# Patient Record
Sex: Female | Born: 1985 | Race: White | Hispanic: No | Marital: Single | Smoking: Current every day smoker
Health system: Southern US, Community
[De-identification: ages and names within clinical notes are randomized; demographics above are authoritative.]

## PROBLEM LIST (undated history)

## (undated) DIAGNOSIS — F419 Anxiety disorder, unspecified: Secondary | ICD-10-CM

## (undated) DIAGNOSIS — J45909 Unspecified asthma, uncomplicated: Secondary | ICD-10-CM

## (undated) DIAGNOSIS — F32A Depression, unspecified: Secondary | ICD-10-CM

## (undated) DIAGNOSIS — K219 Gastro-esophageal reflux disease without esophagitis: Secondary | ICD-10-CM

## (undated) DIAGNOSIS — R000 Tachycardia, unspecified: Secondary | ICD-10-CM

## (undated) DIAGNOSIS — R51 Headache: Secondary | ICD-10-CM

## (undated) DIAGNOSIS — R42 Dizziness and giddiness: Secondary | ICD-10-CM

## (undated) DIAGNOSIS — T7840XA Allergy, unspecified, initial encounter: Secondary | ICD-10-CM

## (undated) DIAGNOSIS — R519 Headache, unspecified: Secondary | ICD-10-CM

## (undated) DIAGNOSIS — E559 Vitamin D deficiency, unspecified: Secondary | ICD-10-CM

## (undated) HISTORY — DX: Tachycardia, unspecified: R00.0

## (undated) HISTORY — DX: Allergy, unspecified, initial encounter: T78.40XA

## (undated) HISTORY — DX: Headache, unspecified: R51.9

## (undated) HISTORY — DX: Anxiety disorder, unspecified: F41.9

## (undated) HISTORY — DX: Headache: R51

## (undated) HISTORY — PX: WISDOM TOOTH EXTRACTION: SHX21

## (undated) HISTORY — DX: Gastro-esophageal reflux disease without esophagitis: K21.9

## (undated) HISTORY — DX: Vitamin D deficiency, unspecified: E55.9

## (undated) HISTORY — DX: Depression, unspecified: F32.A

## (undated) HISTORY — DX: Dizziness and giddiness: R42

---

## 2003-04-03 ENCOUNTER — Encounter: Payer: Self-pay | Admitting: Emergency Medicine

## 2003-04-03 ENCOUNTER — Emergency Department (HOSPITAL_COMMUNITY): Admission: AD | Admit: 2003-04-03 | Discharge: 2003-04-03 | Payer: Self-pay | Admitting: Emergency Medicine

## 2004-05-31 ENCOUNTER — Emergency Department (HOSPITAL_COMMUNITY): Admission: EM | Admit: 2004-05-31 | Discharge: 2004-05-31 | Payer: Self-pay | Admitting: *Deleted

## 2004-06-19 ENCOUNTER — Emergency Department: Payer: Self-pay | Admitting: Emergency Medicine

## 2004-06-19 ENCOUNTER — Other Ambulatory Visit: Payer: Self-pay

## 2004-08-04 ENCOUNTER — Ambulatory Visit (HOSPITAL_COMMUNITY): Admission: RE | Admit: 2004-08-04 | Discharge: 2004-08-04 | Payer: Self-pay | Admitting: Family Medicine

## 2005-08-10 ENCOUNTER — Emergency Department (HOSPITAL_COMMUNITY): Admission: EM | Admit: 2005-08-10 | Discharge: 2005-08-10 | Payer: Self-pay | Admitting: Emergency Medicine

## 2006-03-04 ENCOUNTER — Emergency Department (HOSPITAL_COMMUNITY): Admission: EM | Admit: 2006-03-04 | Discharge: 2006-03-04 | Payer: Self-pay | Admitting: Emergency Medicine

## 2006-09-01 ENCOUNTER — Emergency Department: Payer: Self-pay | Admitting: Emergency Medicine

## 2006-09-03 ENCOUNTER — Emergency Department: Payer: Self-pay | Admitting: Internal Medicine

## 2006-10-10 ENCOUNTER — Emergency Department: Payer: Self-pay | Admitting: Emergency Medicine

## 2007-11-30 ENCOUNTER — Emergency Department (HOSPITAL_COMMUNITY): Admission: EM | Admit: 2007-11-30 | Discharge: 2007-11-30 | Payer: Self-pay | Admitting: Emergency Medicine

## 2008-12-27 ENCOUNTER — Emergency Department (HOSPITAL_COMMUNITY): Admission: EM | Admit: 2008-12-27 | Discharge: 2008-12-28 | Payer: Self-pay | Admitting: Emergency Medicine

## 2014-12-03 ENCOUNTER — Encounter (HOSPITAL_COMMUNITY): Payer: Self-pay

## 2014-12-03 ENCOUNTER — Emergency Department (HOSPITAL_COMMUNITY): Payer: Managed Care, Other (non HMO)

## 2014-12-03 ENCOUNTER — Emergency Department (HOSPITAL_COMMUNITY)
Admission: EM | Admit: 2014-12-03 | Discharge: 2014-12-03 | Disposition: A | Discharge status: Home / Self Care | Payer: Managed Care, Other (non HMO) | Attending: Emergency Medicine | Admitting: Emergency Medicine

## 2014-12-03 DIAGNOSIS — F419 Anxiety disorder, unspecified: Secondary | ICD-10-CM | POA: Diagnosis not present

## 2014-12-03 DIAGNOSIS — R064 Hyperventilation: Secondary | ICD-10-CM | POA: Diagnosis not present

## 2014-12-03 DIAGNOSIS — R079 Chest pain, unspecified: Secondary | ICD-10-CM

## 2014-12-03 DIAGNOSIS — J45901 Unspecified asthma with (acute) exacerbation: Secondary | ICD-10-CM | POA: Diagnosis not present

## 2014-12-03 DIAGNOSIS — R0602 Shortness of breath: Secondary | ICD-10-CM | POA: Diagnosis present

## 2014-12-03 HISTORY — DX: Unspecified asthma, uncomplicated: J45.909

## 2014-12-03 LAB — I-STAT CHEM 8, ED
BUN: <3 mg/dL — ABNORMAL LOW (ref 6–20)
CALCIUM ION: 1.19 mmol/L (ref 1.12–1.23)
Chloride: 104 mmol/L (ref 101–111)
Creatinine, Ser: 0.8 mg/dL (ref 0.44–1.00)
GLUCOSE: 113 mg/dL — AB (ref 70–99)
HEMATOCRIT: 42 % (ref 36.0–46.0)
HEMOGLOBIN: 14.3 g/dL (ref 12.0–15.0)
POTASSIUM: 3.4 mmol/L — AB (ref 3.5–5.1)
Sodium: 141 mmol/L (ref 135–145)
TCO2: 20 mmol/L (ref 0–100)

## 2014-12-03 MED ORDER — IPRATROPIUM-ALBUTEROL 0.5-2.5 (3) MG/3ML IN SOLN
3.0000 mL | Freq: Once | RESPIRATORY_TRACT | Status: AC
  Administered 2014-12-03: 3 mL via RESPIRATORY_TRACT

## 2014-12-03 MED ORDER — KETOROLAC TROMETHAMINE 30 MG/ML IJ SOLN
30.0000 mg | Freq: Once | INTRAMUSCULAR | Status: AC
  Administered 2014-12-03: 30 mg via INTRAVENOUS
  Filled 2014-12-03: qty 1

## 2014-12-03 MED ORDER — METHYLPREDNISOLONE SODIUM SUCC 125 MG IJ SOLR
125.0000 mg | Freq: Once | INTRAMUSCULAR | Status: AC
  Administered 2014-12-03: 125 mg via INTRAVENOUS
  Filled 2014-12-03: qty 2

## 2014-12-03 MED ORDER — ALBUTEROL SULFATE (2.5 MG/3ML) 0.083% IN NEBU
2.5000 mg | INHALATION_SOLUTION | RESPIRATORY_TRACT | Status: DC | PRN
  Administered 2014-12-03: 2.5 mg via RESPIRATORY_TRACT
  Filled 2014-12-03: qty 3

## 2014-12-03 MED ORDER — LORAZEPAM 2 MG/ML IJ SOLN
2.0000 mg | Freq: Once | INTRAMUSCULAR | Status: AC
  Administered 2014-12-03: 2 mg via INTRAVENOUS
  Filled 2014-12-03: qty 1

## 2014-12-03 NOTE — ED Notes (Signed)
Pt appears anxious and is sitting on the side of the bed with her head faced downward. MD Fayrene FearingJames at bedside to assess patient. Aware she refused any anti-anxiety medications.

## 2014-12-03 NOTE — ED Notes (Addendum)
Pt presents with c/o wheezing, hx of asthma. Pt reports she was in [employer] and sprayed bug spray.  Pt is also hyperventilating, and is able to speak in complete and full sentences. 100% on RA.

## 2014-12-03 NOTE — ED Provider Notes (Addendum)
CSN: [id number]     Arrival date & time 12/03/14  2118 History   First MD Initiated Contact with Patient 12/03/14 2124     Chief Complaint  Patient presents with  . Asthma  . Anxiety      HPI  Presents for evaluation hyperventilating. States she has a history of asthma. She went to [employer] and apparently sprayed bug spray in the sent caused her to panic and breathing fast. She thought she was having anxiety attacks. She presents here rather dramatically hyperventilating. Did not have her inhaler with her. States she took a Xanax on the way and" is not helping"  Past Medical History  Diagnosis Date  . Asthma    History reviewed. No pertinent past surgical history. No family history on file. History  Substance Use Topics  . Smoking status: Never Smoker   . Smokeless tobacco: Not on file  . Alcohol Use: No   OB History    No data available     Review of Systems  Constitutional: Negative for fever, chills, diaphoresis, appetite change and fatigue.  HENT: Negative for mouth sores, sore throat and trouble swallowing.   Eyes: Negative for visual disturbance.  Respiratory: Positive for shortness of breath. Negative for cough, chest tightness and wheezing.   Cardiovascular: Negative for chest pain.  Gastrointestinal: Negative for nausea, vomiting, abdominal pain, diarrhea and abdominal distention.  Endocrine: Negative for polydipsia, polyphagia and polyuria.  Genitourinary: Negative for dysuria, frequency and hematuria.  Musculoskeletal: Negative for gait problem.  Skin: Negative for color change, pallor and rash.  Neurological: Negative for dizziness, syncope, light-headedness and headaches.  Hematological: Does not bruise/bleed easily.  Psychiatric/Behavioral: Negative for behavioral problems and confusion. The patient is nervous/anxious.       Allergies  Benadryl and Levaquin  Home Medications   Prior to Admission medications   Medication Sig Start Date End Date  Taking? Authorizing Provider  ALPRAZolam Prudy Feeler(XANAX) 1 MG tablet Take 1 tablet by mouth 2 (two) times daily as needed for anxiety.  12/03/14  Yes Historical Provider, MD  cyclobenzaprine (FLEXERIL) 10 MG tablet Take 1 tablet by mouth 3 (three) times daily as needed for muscle spasms.  12/03/14  Yes Historical Provider, MD  Ibuprofen (MIDOL PO) Take 2 tablets by mouth every 6 (six) hours as needed (cramps).   Yes Historical Provider, MD  oxyCODONE-acetaminophen (PERCOCET) 10-325 MG per tablet Take 1 tablet by mouth every 6 (six) hours as needed for pain.  11/07/14  Yes Historical Provider, MD  temazepam (RESTORIL) 15 MG capsule Take 1 capsule by mouth at bedtime. 10/08/14  Yes Historical Provider, MD   BP 126/80 mmHg  Pulse 121  Temp(Src) 98.4 F (36.9 C) (Oral)  Resp 12  SpO2 97%  LMP 12/01/2014 Physical Exam  Constitutional: She is oriented to person, place, and time. She appears well-developed and well-nourished. No distress.  HENT:  Head: Normocephalic.  Eyes: Conjunctivae are normal. Pupils are equal, round, and reactive to light. No scleral icterus.  Neck: Normal range of motion. Neck supple. No thyromegaly present.  Cardiovascular: Normal rate and regular rhythm.  Exam reveals no gallop and no friction rub.   No murmur heard. Pulmonary/Chest: Effort normal and breath sounds normal. Tachypnea noted. No respiratory distress. She has no wheezes. She has no rales.  Hyperventilating. Wheezing only with forced exhalation.  Abdominal: Soft. Bowel sounds are normal. She exhibits no distension. There is no tenderness. There is no rebound.  Musculoskeletal: Normal range of motion.  Neurological: She  is alert and oriented to person, place, and time.  Skin: Skin is warm and dry. No rash noted.  Psychiatric: Her mood appears anxious. Her affect is labile. Her speech is rapid and/or pressured.    ED Course  Procedures (including critical care time) Labs Review Labs Reviewed  I-STAT CHEM 8, ED -  Abnormal; Notable for the following:    Potassium 3.4 (*)    BUN <3 (*)    Glucose, Bld 113 (*)    All other components within normal limits    Imaging Review No results found.   EKG Interpretation None      MDM   Final diagnoses:  Chest pain  Hyperventilation    Patient given IV Ativan. Was given 1 albuterol neb. Normal chest x-ray other than some hyperventilation. Potassium 3.4. Normal EKG other than tachycardia. Well oxygenated 100%. Think she is appropriate for discharge home.    Rolland PorterMark Roxanne Orner, MD 12/03/14 [id number]  Rolland PorterMark Unita Detamore, MD 12/03/14 [phone]

## 2014-12-03 NOTE — Discharge Instructions (Signed)
Hyperventilation °Hyperventilation is breathing that is deeper and more rapid than normal. It is usually associated with panic and anxiety. Hyperventilation can make you feel breathless. It is sometimes called overbreathing. Breathing out too much causes a decrease in the amount of carbon dioxide gas in the blood. This leads to tingling and numbness in the hands, feet, and around the mouth. If this continues, your fingers, hands, and toes may begin to spasm. Hyperventilation usually lasts 20-30 minutes and can be associated with other symptoms of panic and anxiety, including:  °· Chest pains or tightness. °· A pounding or irregular, racing heartbeat (palpitations). °· Dizziness. °· Lightheadedness. °· Dry mouth. °· Weakness. °· Confusion. °· Sleep disturbance. °CAUSES  °Sudden onset (acute) hyperventilation is usually triggered by acute stress, anxiety, or emotional upset. Long-term (chronic) and recurring hyperventilation can occur with chronic lung problems, such emphysema or asthma. Other causes include:  °· Nervousness. °· Stress. °· Stimulant, drug, or alcohol use. °· Lung disease. °· Infections, such as pneumonia. °· Heart problems. °· Severe pain. °· Waking from a bad dream. °· Pregnancy. °· Bleeding. °HOME CARE INSTRUCTIONS °· Learn and use breathing exercises that help you breathe from your diaphragm and abdomen. °· Practice relaxation techniques to reduce stress, such as visualization, meditation, and muscle release. °· During an attack, try breathing into a paper bag. This changes the carbon dioxide level and slows down breathing. °SEEK IMMEDIATE MEDICAL CARE IF: °· Your hyperventilation continues or gets worse. °MAKE SURE YOU: °· Understand these instructions. °· Will watch your condition. °· Will get help right away if you are not doing well or get worse. °Document Released: 07/14/2000 Document Revised: 01/16/2012 Document Reviewed: 10/26/2011 °ExitCare® Patient Information ©2015 [employer]. This  information is not intended to replace advice given to you by your health care provider. Make sure you discuss any questions you have with your health care provider. ° °

## 2014-12-03 NOTE — ED Notes (Signed)
Pt audibly wheezing but is able to speak without labored breathing or wheezing intermittently. Able to carry on full conversation without pause before medications administered. Patient appears increasingly calm and relaxed after medication. Will re-evaluate promptly.

## 2016-09-19 ENCOUNTER — Other Ambulatory Visit (HOSPITAL_COMMUNITY): Payer: Self-pay | Admitting: Internal Medicine

## 2016-09-19 DIAGNOSIS — R51 Headache: Principal | ICD-10-CM

## 2016-09-19 DIAGNOSIS — R519 Headache, unspecified: Secondary | ICD-10-CM

## 2016-09-20 ENCOUNTER — Ambulatory Visit (HOSPITAL_COMMUNITY)
Admission: RE | Admit: 2016-09-20 | Discharge: 2016-09-20 | Disposition: A | Discharge status: Home / Self Care | Payer: Managed Care, Other (non HMO) | Source: Ambulatory Visit | Attending: Internal Medicine | Admitting: Internal Medicine

## 2016-09-20 DIAGNOSIS — R51 Headache: Secondary | ICD-10-CM | POA: Diagnosis not present

## 2016-09-20 DIAGNOSIS — R200 Anesthesia of skin: Secondary | ICD-10-CM | POA: Insufficient documentation

## 2016-09-20 DIAGNOSIS — R519 Headache, unspecified: Secondary | ICD-10-CM

## 2016-09-20 DIAGNOSIS — Z9689 Presence of other specified functional implants: Secondary | ICD-10-CM | POA: Insufficient documentation

## 2016-10-09 ENCOUNTER — Telehealth: Payer: Self-pay | Admitting: *Deleted

## 2016-10-09 NOTE — Telephone Encounter (Signed)
Left message notifying her that our office will be on a delayed opening on 10/09/16 and we need to reschedule her new patient appt.  Provided our number to call back.

## 2016-10-10 ENCOUNTER — Ambulatory Visit: Payer: Managed Care, Other (non HMO) | Admitting: Neurology

## 2016-10-24 ENCOUNTER — Ambulatory Visit (INDEPENDENT_AMBULATORY_CARE_PROVIDER_SITE_OTHER): Payer: Managed Care, Other (non HMO) | Admitting: Neurology

## 2016-10-24 ENCOUNTER — Encounter: Payer: Self-pay | Admitting: Neurology

## 2016-10-24 DIAGNOSIS — R200 Anesthesia of skin: Secondary | ICD-10-CM | POA: Diagnosis not present

## 2016-10-24 DIAGNOSIS — R42 Dizziness and giddiness: Secondary | ICD-10-CM | POA: Insufficient documentation

## 2016-10-24 MED ORDER — CYCLOBENZAPRINE HCL 10 MG PO TABS: 10.0000 mg | ORAL_TABLET | Freq: Three times a day (TID) | ORAL | 1 refills | Status: DC | PRN

## 2016-10-24 MED ORDER — DIAZEPAM 5 MG PO TABS: 5.0000 mg | ORAL_TABLET | Freq: Four times a day (QID) | ORAL | 0 refills | Status: DC | PRN
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
## 2016-11-16 ENCOUNTER — Ambulatory Visit
Admission: RE | Admit: 2016-11-16 | Discharge: 2016-11-16 | Disposition: A | Discharge status: Home / Self Care | Payer: Managed Care, Other (non HMO) | Source: Ambulatory Visit | Attending: Neurology | Admitting: Neurology

## 2016-11-16 DIAGNOSIS — R42 Dizziness and giddiness: Secondary | ICD-10-CM | POA: Diagnosis not present

## 2016-11-16 DIAGNOSIS — R200 Anesthesia of skin: Secondary | ICD-10-CM | POA: Diagnosis not present

## 2016-11-22 ENCOUNTER — Telehealth: Payer: Self-pay | Admitting: Neurology

## 2016-11-22 NOTE — Telephone Encounter (Addendum)
Please call patient MRI of brain showed evidence of active left maxillary sinus infection,evidence of fluid level in the left maxillary sinus  Brain parenchyma appears normal   asked her to see if she has any signs of infection she may contact her primary care for treatment IMPRESSION:  This MRI of the brain without contrast shows the following: 1.    The brain parenchyma appears normal. 2.    Acute left maxillary sinusitis.

## 2016-11-22 NOTE — Telephone Encounter (Deleted)
Please call patient MRI of brain showed evidence of active left maxillary sinus infection,evidence of fluid level in the left maxillary sinus   asked her to see if she has any signs of infection she may contact her primary care for treatment

## 2016-11-23 NOTE — Telephone Encounter (Signed)
error 

## 2016-11-23 NOTE — Telephone Encounter (Addendum)
Left patient detailed message with results and Dr. Zannie Cove recommendations (ok per [facility]).  Provided our number to call back with any questions.

## 2016-11-28 ENCOUNTER — Encounter: Payer: Self-pay | Admitting: Neurology

## 2016-11-28 ENCOUNTER — Ambulatory Visit (INDEPENDENT_AMBULATORY_CARE_PROVIDER_SITE_OTHER): Payer: Managed Care, Other (non HMO) | Admitting: Neurology

## 2016-11-28 VITALS — BP 140/86 | HR 119 | Ht 65.0 in | Wt 188.5 lb

## 2016-11-28 DIAGNOSIS — R42 Dizziness and giddiness: Secondary | ICD-10-CM | POA: Diagnosis not present

## 2016-11-28 DIAGNOSIS — R200 Anesthesia of skin: Secondary | ICD-10-CM

## 2016-11-28 MED ORDER — AMITRIPTYLINE HCL 25 MG PO TABS: 50.0000 mg | ORAL_TABLET | Freq: Every day | ORAL | 11 refills | Status: DC

## 2016-11-28 MED ORDER — ONDANSETRON 4 MG PO TBDP: 4.0000 mg | ORAL_TABLET | Freq: Three times a day (TID) | ORAL | 6 refills | Status: DC | PRN

## 2016-11-28 MED ORDER — ELETRIPTAN HYDROBROMIDE 40 MG PO TABS: 40.0000 mg | ORAL_TABLET | ORAL | 6 refills | Status: DC | PRN
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
## 2017-01-30 ENCOUNTER — Ambulatory Visit: Payer: Managed Care, Other (non HMO) | Admitting: Nurse Practitioner

## 2017-02-07 ENCOUNTER — Ambulatory Visit (INDEPENDENT_AMBULATORY_CARE_PROVIDER_SITE_OTHER): Payer: Self-pay | Admitting: Nurse Practitioner

## 2017-02-07 ENCOUNTER — Encounter: Payer: Self-pay | Admitting: Nurse Practitioner

## 2017-02-07 VITALS — BP 130/83 | HR 114 | Wt 186.4 lb

## 2017-02-07 DIAGNOSIS — R42 Dizziness and giddiness: Secondary | ICD-10-CM

## 2017-02-07 DIAGNOSIS — R519 Headache, unspecified: Secondary | ICD-10-CM

## 2017-02-07 DIAGNOSIS — R200 Anesthesia of skin: Secondary | ICD-10-CM

## 2017-02-07 DIAGNOSIS — R51 Headache: Secondary | ICD-10-CM

## 2017-02-07 DIAGNOSIS — G478 Other sleep disorders: Secondary | ICD-10-CM
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
## 2017-02-07 NOTE — Patient Instructions (Signed)
Will obtain MRI of the neck Continue curent meds  F/U with Dr. Terrace ArabiaYan in 2 months

## 2017-02-08 NOTE — Progress Notes (Signed)
I agree with the assessment and plan as directed by NP .WID   Ossie Beltran, MD  

## 2017-03-24 ENCOUNTER — Other Ambulatory Visit: Payer: Self-pay | Admitting: Neurology

## 2017-04-19 ENCOUNTER — Ambulatory Visit: Payer: Managed Care, Other (non HMO) | Admitting: Nurse Practitioner

## 2017-10-31 IMAGING — CT CT HEAD W/O CM
4 series · 17 of 47 positions shown, 19 images · non-contrast
Comparison: None.

CLINICAL DATA: Fell several days ago with small laceration of the
forehead, vomited, no loss of consciousness, continued headache

EXAM:
CT HEAD WITHOUT CONTRAST
TECHNIQUE: Contiguous axial images were obtained from the base of the skull
through the vertex without intravenous contrast.

[Series 2: head trauma wo · axial · 0.42mm/px · z∈[+14,+124]mm · 7 of 30 slices shown, 9 images]
[im 4/30  brain]
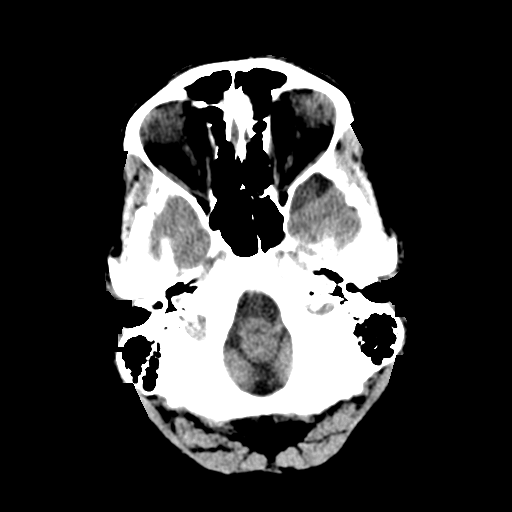
[im 4/30  bone]
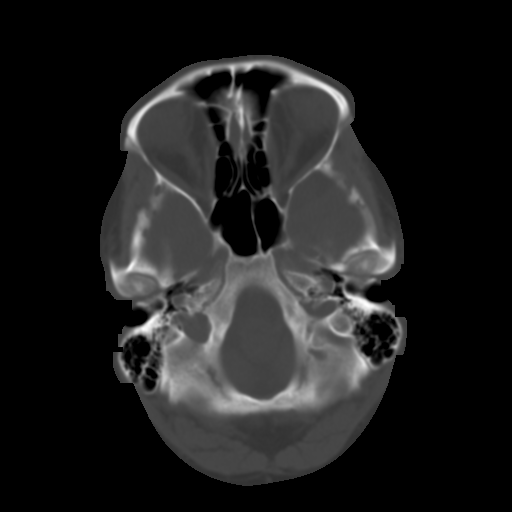
[im 8/30  brain]
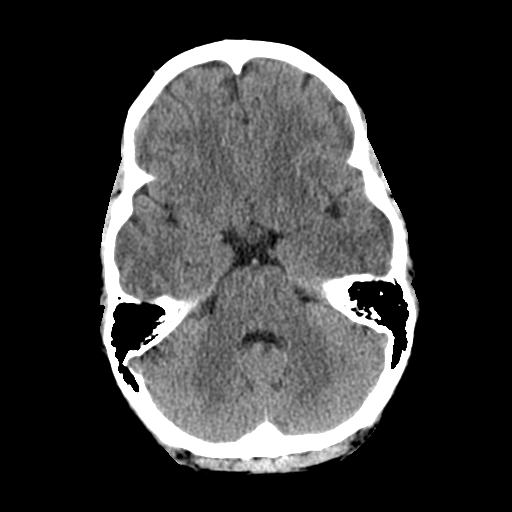
[im 11/30  brain]
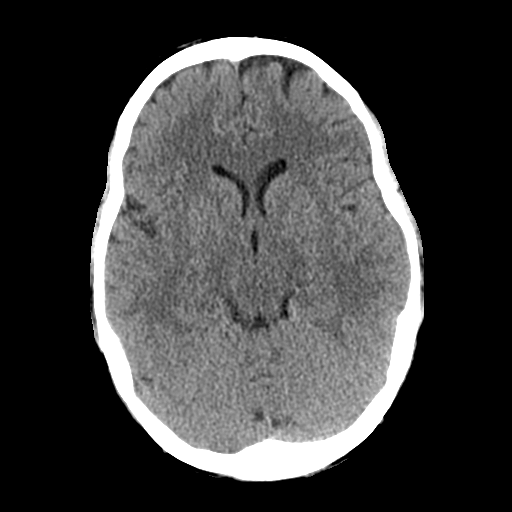
[im 15/30  brain]
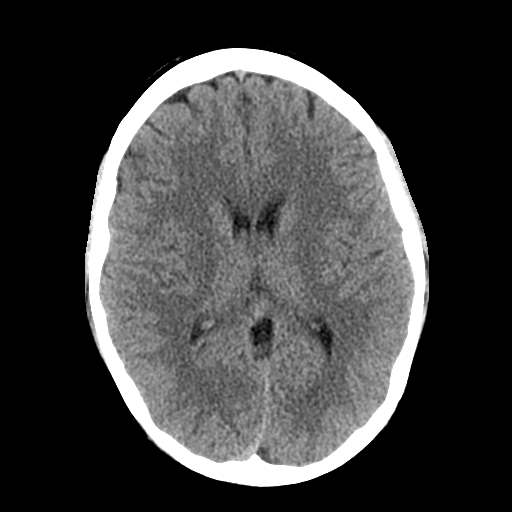
[im 19/30  brain]
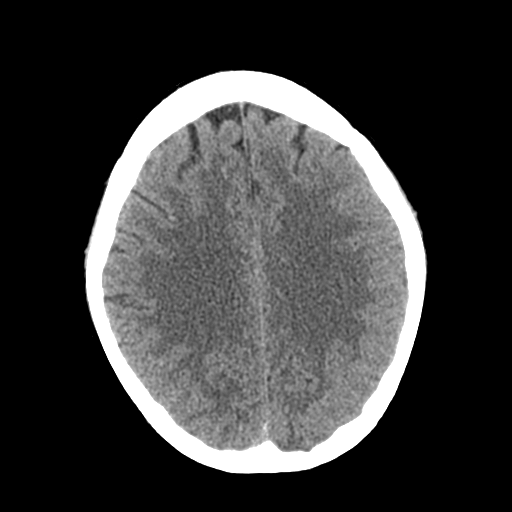
[im 19/30  bone]
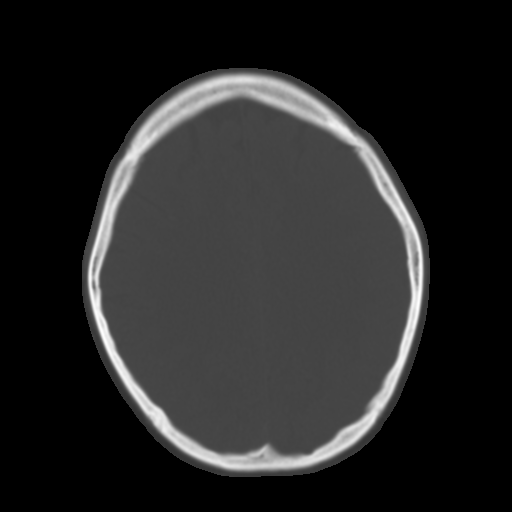
[im 22/30  brain]
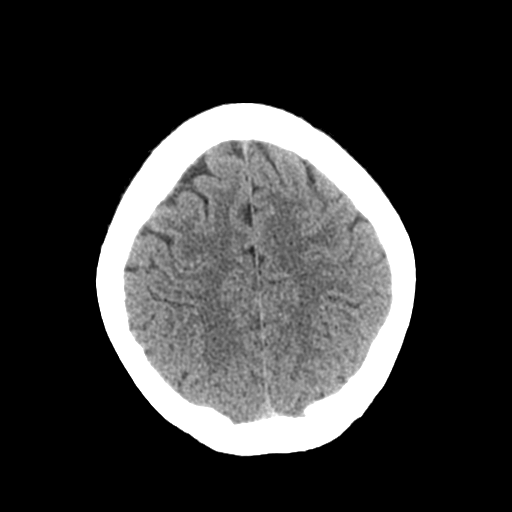
[im 26/30  brain]
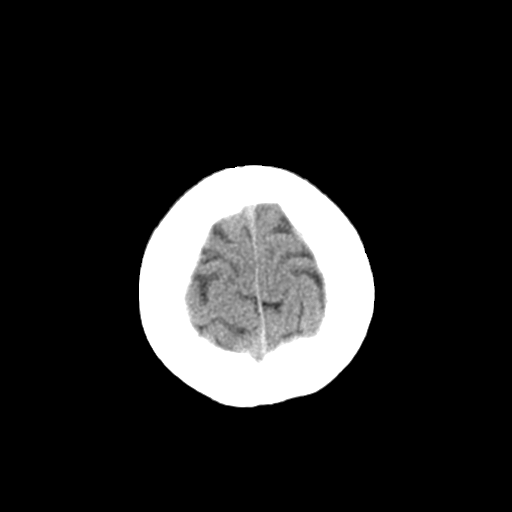

[Series 3: head bone · axial · 0.42mm/px · z∈[+13,+65]mm · 4 of 75 slices shown]
[im 8/75  bone]
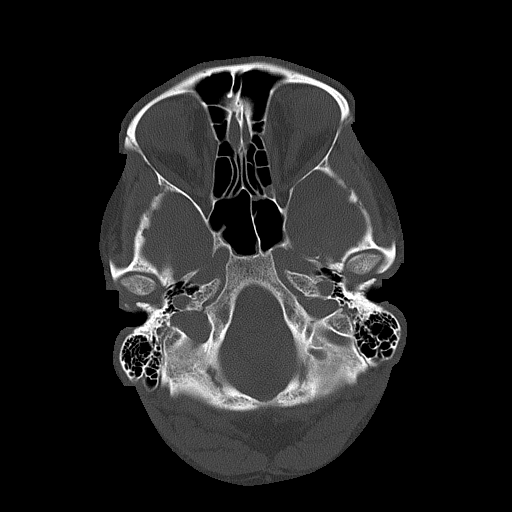
[im 15/75  bone]
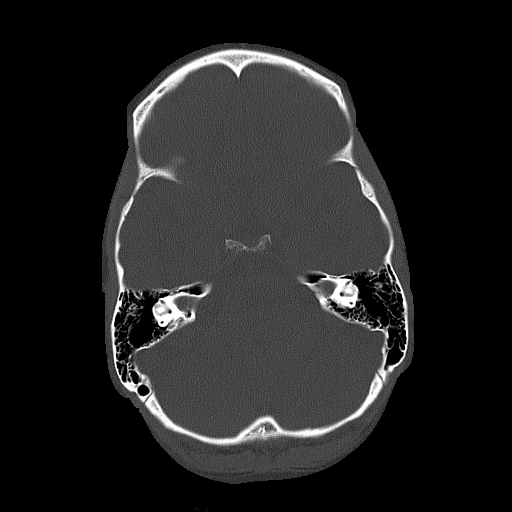
[im 23/75  bone]
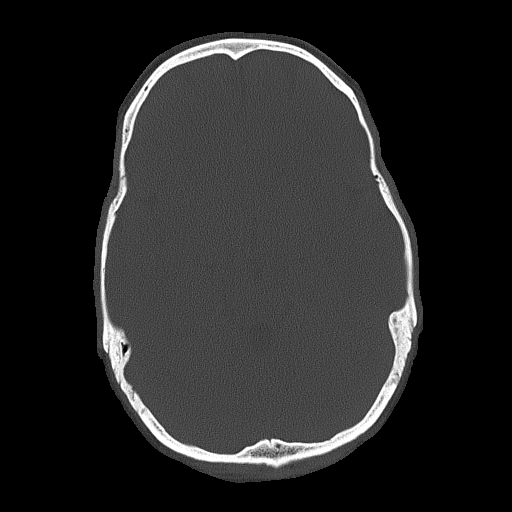
[im 34/75  bone]
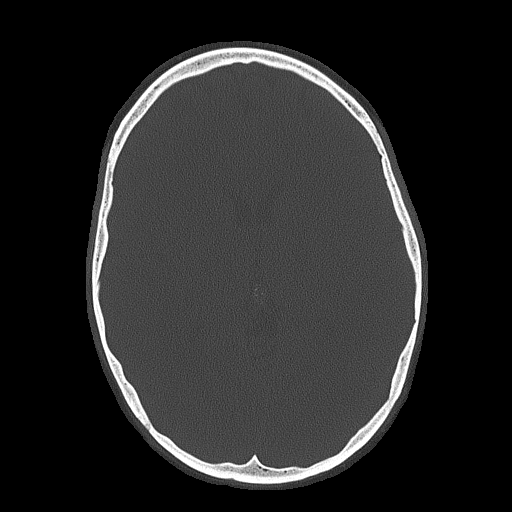

[Series 4: coronal soft tissue · coronal · 0.31mm/px · 3 of 67 slices shown]
[im 23/67  brain]
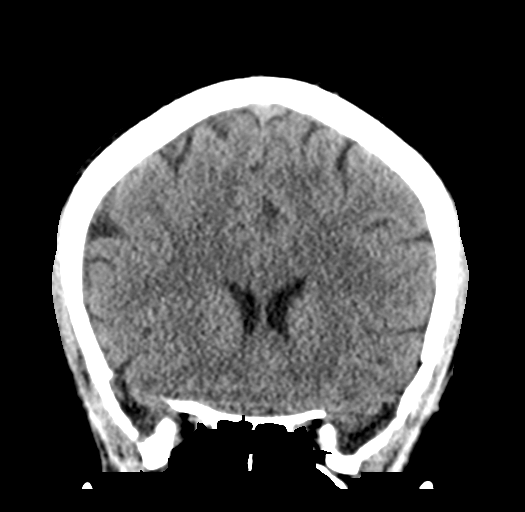
[im 30/67  brain]
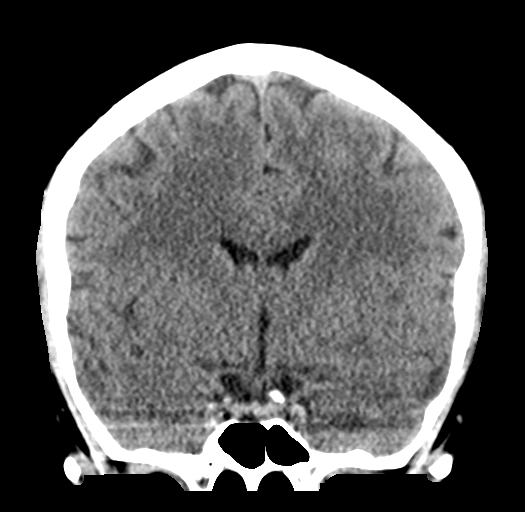
[im 37/67  brain]
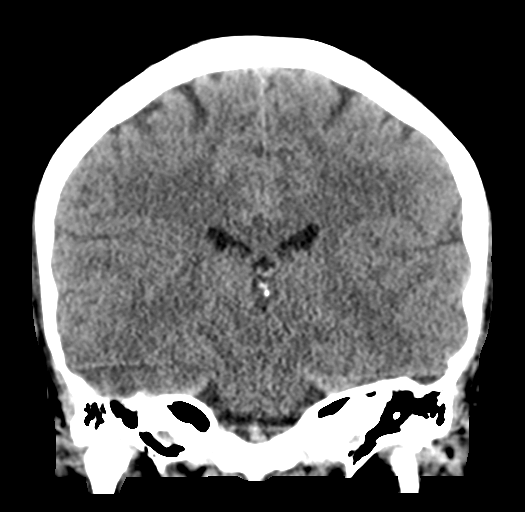

[Series 5: sagittal soft tissue · sagittal · 0.32mm/px · 3 of 55 slices shown]
[im 19/55  brain]
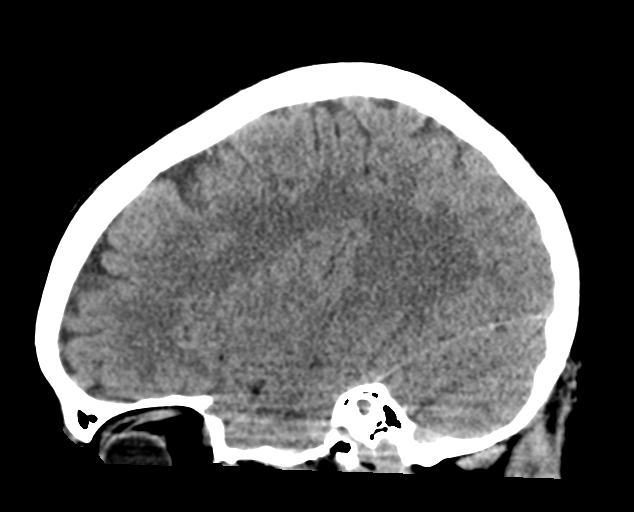
[im 28/55  brain]
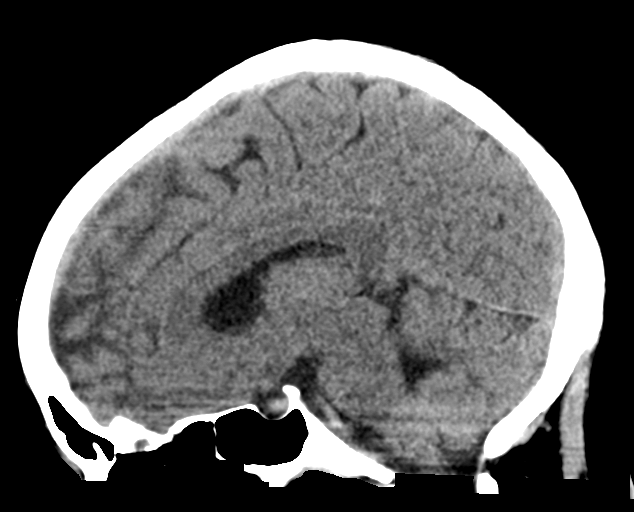
[im 37/55  brain]
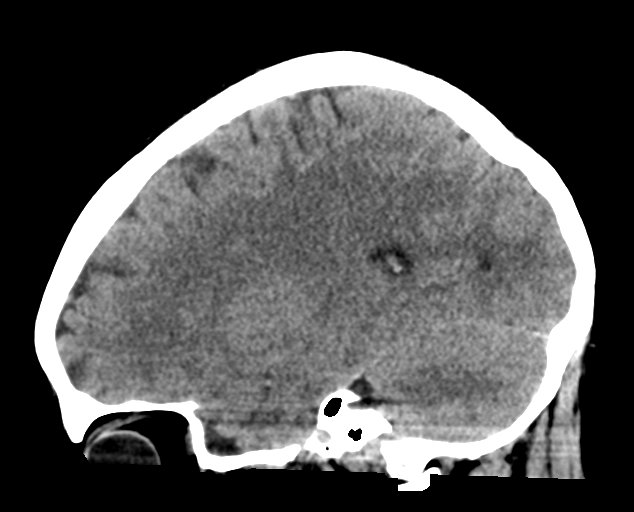

[17 of 47 positions shown; findings below may reference images not displayed]

FINDINGS: Brain: The ventricular system is normal in size and configuration
and the septum is midline in position. The fourth ventricle and
basilar cisterns are unremarkable. No hemorrhage, mass lesion, or
acute infarction is seen.

Vascular: No vascular abnormality is seen on this unenhanced study.

Skull: On bone window images, no calvarial abnormality is seen.

Sinuses/Orbits: The paranasal sinuses appear well pneumatized.

Other: Surgical staples are noted in the soft tissues of the right
frontal calvarium.
IMPRESSION: 1. Negative unenhanced CT of the brain.
2. Staples are noted within the soft tissues of the right frontal
scalp.

## 2020-02-27 ENCOUNTER — Encounter: Payer: Managed Care, Other (non HMO) | Admitting: Adult Health

## 2020-03-31 ENCOUNTER — Encounter: Payer: Self-pay | Admitting: Adult Health

## 2020-05-04 ENCOUNTER — Telehealth (INDEPENDENT_AMBULATORY_CARE_PROVIDER_SITE_OTHER): Payer: Self-pay

## 2020-05-17 NOTE — Telephone Encounter (Signed)
Not a patient; she was calling for father after she gave me all her own information. Error opening.

## 2022-04-29 ENCOUNTER — Ambulatory Visit
Admission: EM | Admit: 2022-04-29 | Discharge: 2022-04-29 | Disposition: A | Discharge status: Home / Self Care | Payer: Self-pay

## 2022-04-29 ENCOUNTER — Emergency Department (HOSPITAL_COMMUNITY)
Admission: EM | Admit: 2022-04-29 | Discharge: 2022-04-29 | Disposition: A | Discharge status: Home / Self Care | Payer: Self-pay | Attending: Emergency Medicine | Admitting: Emergency Medicine

## 2022-04-29 ENCOUNTER — Encounter (HOSPITAL_COMMUNITY): Payer: Self-pay | Admitting: *Deleted

## 2022-04-29 ENCOUNTER — Emergency Department (HOSPITAL_COMMUNITY): Payer: Self-pay

## 2022-04-29 ENCOUNTER — Other Ambulatory Visit: Payer: Self-pay

## 2022-04-29 DIAGNOSIS — J45901 Unspecified asthma with (acute) exacerbation: Secondary | ICD-10-CM | POA: Insufficient documentation

## 2022-04-29 DIAGNOSIS — Z7951 Long term (current) use of inhaled steroids: Secondary | ICD-10-CM | POA: Insufficient documentation

## 2022-04-29 DIAGNOSIS — F411 Generalized anxiety disorder: Secondary | ICD-10-CM

## 2022-04-29 DIAGNOSIS — R062 Wheezing: Secondary | ICD-10-CM

## 2022-04-29 DIAGNOSIS — F419 Anxiety disorder, unspecified: Secondary | ICD-10-CM | POA: Insufficient documentation

## 2022-04-29 LAB — CBC WITH DIFFERENTIAL/PLATELET
Abs Immature Granulocytes: 0.54 10*3/uL — ABNORMAL HIGH (ref 0.00–0.07)
Basophils Absolute: 0.1 10*3/uL (ref 0.0–0.1)
Basophils Relative: 0 %
Eosinophils Absolute: 0 10*3/uL (ref 0.0–0.5)
Eosinophils Relative: 0 %
HCT: 39.9 % (ref 36.0–46.0)
Hemoglobin: 13.5 g/dL (ref 12.0–15.0)
Immature Granulocytes: 2 %
Lymphocytes Relative: 12 %
Lymphs Abs: 3.1 10*3/uL (ref 0.7–4.0)
MCH: 30.5 pg (ref 26.0–34.0)
MCHC: 33.8 g/dL (ref 30.0–36.0)
MCV: 90.3 fL (ref 80.0–100.0)
Monocytes Absolute: 0.9 10*3/uL (ref 0.1–1.0)
Monocytes Relative: 4 %
Neutro Abs: 20.9 10*3/uL — ABNORMAL HIGH (ref 1.7–7.7)
Neutrophils Relative %: 82 %
Platelets: 470 10*3/uL — ABNORMAL HIGH (ref 150–400)
RBC: 4.42 MIL/uL (ref 3.87–5.11)
RDW: 12.9 % (ref 11.5–15.5)
WBC: 25.5 10*3/uL — ABNORMAL HIGH (ref 4.0–10.5)
nRBC: 0 % (ref 0.0–0.2)

## 2022-04-29 LAB — BASIC METABOLIC PANEL
Anion gap: 7 (ref 5–15)
BUN: 14 mg/dL (ref 6–20)
CO2: 26 mmol/L (ref 22–32)
Calcium: 9.1 mg/dL (ref 8.9–10.3)
Chloride: 102 mmol/L (ref 98–111)
Creatinine, Ser: 0.68 mg/dL (ref 0.44–1.00)
GFR, Estimated: >60 mL/min (ref >60)
Glucose, Bld: 130 mg/dL — ABNORMAL HIGH (ref 70–99)
Potassium: 3.6 mmol/L (ref 3.5–5.1)
Sodium: 135 mmol/L (ref 135–145)

## 2022-04-29 LAB — BRAIN NATRIURETIC PEPTIDE: B Natriuretic Peptide: 57 pg/mL (ref 0.0–100.0)

## 2022-04-29 MED ORDER — IPRATROPIUM-ALBUTEROL 0.5-2.5 (3) MG/3ML IN SOLN
RESPIRATORY_TRACT | Status: AC
  Administered 2022-04-29: 3 mL via RESPIRATORY_TRACT
  Filled 2022-04-29: qty 3

## 2022-04-29 MED ORDER — MAGNESIUM SULFATE 2 GM/50ML IV SOLN: 2.0000 g | Freq: Once | INTRAVENOUS | Status: DC

## 2022-04-29 MED ORDER — PREDNISONE 20 MG PO TABS: 40.0000 mg | ORAL_TABLET | Freq: Every day | ORAL | 0 refills | Status: AC

## 2022-04-29 MED ORDER — LEVALBUTEROL HCL 0.63 MG/3ML IN NEBU
INHALATION_SOLUTION | RESPIRATORY_TRACT | Status: AC
  Filled 2022-04-29: qty 3

## 2022-04-29 MED ORDER — IPRATROPIUM-ALBUTEROL 0.5-2.5 (3) MG/3ML IN SOLN: 3.0000 mL | Freq: Once | RESPIRATORY_TRACT | Status: AC

## 2022-04-29 MED ORDER — LEVALBUTEROL HCL 0.63 MG/3ML IN NEBU
0.6300 mg | INHALATION_SOLUTION | Freq: Once | RESPIRATORY_TRACT | Status: AC
  Administered 2022-04-29: 0.63 mg via RESPIRATORY_TRACT

## 2022-04-29 MED ORDER — LORAZEPAM 0.5 MG PO TABS: 0.5000 mg | ORAL_TABLET | ORAL | Status: DC | PRN

## 2022-04-29 MED ORDER — IPRATROPIUM-ALBUTEROL 0.5-2.5 (3) MG/3ML IN SOLN
3.0000 mL | Freq: Once | RESPIRATORY_TRACT | Status: AC
  Administered 2022-04-29: 3 mL via RESPIRATORY_TRACT

## 2022-04-29 MED ORDER — ONDANSETRON HCL 4 MG/2ML IJ SOLN
4.0000 mg | Freq: Once | INTRAMUSCULAR | Status: AC | PRN
  Administered 2022-04-29: 4 mg via INTRAVENOUS
  Filled 2022-04-29: qty 2

## 2022-04-29 MED ORDER — LORAZEPAM 2 MG/ML IJ SOLN: 1.0000 mg | Freq: Once | INTRAMUSCULAR | Status: DC

## 2022-04-29 MED ORDER — METHYLPREDNISOLONE SODIUM SUCC 125 MG IJ SOLR
125.0000 mg | Freq: Every day | INTRAMUSCULAR | Status: DC
  Administered 2022-04-29: 125 mg via INTRAVENOUS
  Filled 2022-04-29: qty 2

## 2022-04-29 NOTE — Discharge Instructions (Signed)
Thank you for coming to [facility] Emergency Department. You were seen for asthma exacerbation. We did an exam, labs, and imaging, and these showed a moderate to severe asthma exacerbation.  You were not requiring oxygen while you were here and your symptoms improved some but I still recommended that you be admitted to the hospital, which you declined.  You were electing to leave [place].  You have excepted the risk of severe morbidity mortality up to and including death as a result of you leaving the hospital today.  We will provide you prescription for 40 mg prednisone x5 days to take for your asthma exacerbation, as well as please use your albuterol inhaler every 4 hours at home.  If your symptoms worsen, please come back to the emergency department.  Please follow up with your primary care provider within 1 week.  Please call [facility] the number provided here to schedule an appointment this week  Do not hesitate to return to the ED or call 911 if you experience: -Worsening symptoms -Lightheadedness, passing out -Fevers/chills -Anything else that concerns you

## 2022-04-29 NOTE — Discharge Instructions (Signed)
Proceed to ED for evaluation.

## 2022-04-29 NOTE — ED Notes (Addendum)
CN made aware of pt. RT paged to see/ round on pt.

## 2022-04-29 NOTE — ED Provider Notes (Signed)
Patient immediately redirected to ED for evaluation. prior to being evaluated by the provider based upon patient's self-described self-treatment today for her wheezing with duo-nebs and prednisone.  Charge capture set to [phone] because the patient had already submitted co-pay.   Rose Phi, [place] 04/29/22 [phone]

## 2022-04-29 NOTE — ED Provider Notes (Addendum)
[facility] EMERGENCY DEPARTMENT Provider Note   CSN: [id number] Arrival date & time: 04/29/22  1631     History  Chief Complaint  Patient presents with   Asthma    Nicole Bird is a 36 y.o. female with asthma, headaches presents with SOB, c/f asthma exacerbation.   Patient states she began to have wheezing and shortness of breath that started approximately 1 week ago.  She was using her albuterol inhaler at home every 4 hours but it did not really help.  Patient states that she is a Engineer, civil (consulting) and has been taking 80 mg of prednisone every day self prescribed at home additionally got one of her nursing friends to give her a shot of 100 mg of Solu-Medrol which improved her symptoms for approximately 24 hours but then they came back.  Denies any fevers or chills, chest pain, nausea vomiting diarrhea constipation, lower extremity edema.  Endorses cough but not coughing anything up.  Has taken several at home COVID test this week all of which have been negative.  Patient was seen in urgent care and informed that she would be brought to the emergency department by ambulance but patient refused and stated she would drive herself. Patient states that he has "seasonal asthma."  She has an albuterol inhaler at home and used to be on Symbicort but has not taken it in many years.  Reports that she has never been hospitalized or intubated for her asthma.   Asthma       Home Medications Prior to Admission medications   Medication Sig Start Date End Date Taking? Authorizing Provider  predniSONE (DELTASONE) 20 MG tablet Take 2 tablets (40 mg total) by mouth daily for 5 days. 04/29/22 05/04/22 Yes Loetta Rough, MD  ALPRAZolam Prudy Feeler) 1 MG tablet Take 1 tablet by mouth 2 (two) times daily as needed for anxiety.  12/03/14   [provider]  amitriptyline (ELAVIL) 25 MG tablet Take 2 tablets (50 mg total) by mouth at bedtime. 11/28/16   Levert Feinstein, MD  cetirizine (ZYRTEC) 10 MG tablet Take 10 mg by  mouth daily.    [provider]  Cholecalciferol (VITAMIN D PO) Take 5,000 Units by mouth daily.    [provider]  cyclobenzaprine (FLEXERIL) 10 MG tablet TAKE 1 TABLET(10 MG) BY MOUTH THREE TIMES DAILY AS NEEDED FOR MUSCLE SPASMS 03/26/17   Levert Feinstein, MD  eletriptan (RELPAX) 40 MG tablet Take 1 tablet (40 mg total) by mouth as needed for migraine or headache. May repeat in 2 hours if headache persists or recurs. Patient not taking: Reported on 02/07/2017 11/28/16   Levert Feinstein, MD  ondansetron (ZOFRAN ODT) 4 MG disintegrating tablet Take 1 tablet (4 mg total) by mouth every 8 (eight) hours as needed. 11/28/16   Levert Feinstein, MD      Allergies    Benadryl [diphenhydramine hcl (sleep)] and Levaquin [levofloxacin in d5w]    Review of Systems   Review of Systems Review of systems negative for F/C.  A 10 point review of systems was performed and is negative unless otherwise reported in HPI.  Physical Exam Updated Vital Signs BP 137/80   Pulse (!) 110   Resp 18   Ht 5\' 5"  (1.651 m)   Wt 95.3 kg   LMP  (LMP Unknown)   SpO2 96%   BMI 34.95 kg/m  Physical Exam General: Extremely anxious, tearful female sitting up in bed.  HEENT: PERRLA, Sclera anicteric, MMM, trachea midline. Cardiology: Regular  tachycardic rate, no murmurs/rubs/gallops. BL radial and DP pulses equal bilaterally.  Resp: Tachypneic into high 20s low 30s breaths per minute. Loud expiratory wheezing throughout all lung fields. Mildly increased WOB, mild respiratory distress. Abd: Soft, non-tender, non-distended. No rebound tenderness or guarding.  GU: Deferred. MSK: No peripheral edema or signs of trauma. Extremities without deformity or TTP. No cyanosis or clubbing. Skin: warm, dry. No rashes or lesions. Back: No CVA tenderness Neuro: A&Ox4, CNs II-XII grossly intact. MAEs. Sensation grossly intact.  Psych: Anxious, uncooperative, irritable, tearful affect.   ED Results / Procedures / Treatments   Labs (all  labs ordered are listed, but only abnormal results are displayed) Labs Reviewed  BASIC METABOLIC PANEL - Abnormal; Notable for the following components:      Result Value   Glucose, Bld 130 (*)    All other components within normal limits  CBC WITH DIFFERENTIAL/PLATELET - Abnormal; Notable for the following components:   WBC 25.5 (*)    Platelets 470 (*)    Neutro Abs 20.9 (*)    Abs Immature Granulocytes 0.54 (*)    All other components within normal limits  RESP PANEL BY RT-PCR (FLU A&B, COVID) ARPGX2  BRAIN NATRIURETIC PEPTIDE  POC URINE PREG, ED    EKG EKG Interpretation  Date/Time:  Saturday April 29 2022 18:27:23 EDT Ventricular Rate:  101 PR Interval:  126 QRS Duration: 78 QT Interval:  335 QTC Calculation: 435 R Axis:   61 Text Interpretation: Sinus tachycardia Ventricular premature complex Aberrant complex Low voltage, precordial leads Confirmed by Vivi Barrack [phone]) on 04/29/2022 9:09:56 PM  Radiology DG Chest Portable 1 View  Result Date: 04/29/2022 CLINICAL DATA:  Shortness of breath. EXAM: PORTABLE CHEST 1 VIEW COMPARISON:  June 05, 2015 FINDINGS: The heart size and mediastinal contours are within normal limits. Mild atelectasis is seen within the bilateral lung bases. There is no evidence of focal consolidation, pleural effusion or pneumothorax. The visualized skeletal structures are unremarkable. IMPRESSION: Mild bibasilar atelectasis. Electronically Signed   By: Aram Candela M.D.   On: 04/29/2022 17:54    Procedures Procedures    Medications Ordered in ED Medications  methylPREDNISolone sodium succinate (SOLU-MEDROL) 125 mg/2 mL injection 125 mg (125 mg Intravenous Given 04/29/22 1739)  LORazepam (ATIVAN) injection 1 mg (1 mg Intravenous Not Given 04/29/22 1729)  magnesium sulfate IVPB 2 g 50 mL (2 g Intravenous Not Given 04/29/22 1729)  ipratropium-albuterol (DUONEB) 0.5-2.5 (3) MG/3ML nebulizer solution 3 mL (3 mLs Nebulization Given 04/29/22  1723)  ipratropium-albuterol (DUONEB) 0.5-2.5 (3) MG/3ML nebulizer solution 3 mL (3 mLs Nebulization Given 04/29/22 1725)  ondansetron (ZOFRAN) injection 4 mg (4 mg Intravenous Given 04/29/22 1739)  levalbuterol (XOPENEX) nebulizer solution 0.63 mg ( Nebulization Canceled Entry 04/29/22 1957)    ED Course/ Medical Decision Making/ A&P                          Medical Decision Making Amount and/or Complexity of Data Reviewed Labs: ordered. Decision-making details documented in ED Course. Radiology: ordered. Decision-making details documented in ED Course.  Risk Prescription drug management.    On arrival, patient with extremely anxious and tearful disposition very tachypneic, loud expiratory wheezing heard without stethoscope. Patient speaking in full sentences. Refusing most interventions.   DDX for dyspnea includes but is not limited to: Cardiac- CHF, Myocardial Ischemia, Arrythmia Respiratory - Pneumonia / atelectasis / pulmonary effusion, asthma Other - Sepsis, Anemia   Patient with no orthopnea/CP/leg swelling  to suggest CHF, PE, or ACS. No f/c to suggest PNA though will obtain CXR.   Patient is extremely anxious.  Stating that the last time she came to this hospital, she brought her mother here and her mother did not make it.  Explained to patient that she is in the correct location and that we are going to take care of her.  Patient states, "you are going to kill me today, I am going to die! I am going to code and no one is going to know why!"  I offered patient lorazepam for her anxiety and she states, " I do not want you to put me to sleep!"  I explained that the dose that I would give is 0.5-1 mg just to help calm anxiety and not want to sedate or put her to sleep, but patient refuses.  I explained that part of the reason she is potentially tachypneic is due to anxiety and that I do not want her to be so tachypneic for so long which increases the risk of worsening her respiratory  distress or potentially causing her to syncopized.  Patient maintains that she does not want the Ativan at this time.  I explained to patient that I am going to give her DuoNebs, methylprednisolone IV, and magnesium 2 g over 30 minutes for her asthma attack.  Patient states that the last time she received magnesium she almost died because her heart rate was more than 250 bpm. I explained that the potential adverse effects of Mg are actually heart block and decreased heart rate, rather than increased HR,but she refused the magnesium anyway. I informed the patient that refusing treatments for her asthma attack could potentially mean she gets worse, and she understands the risks.   Chest x-ray demonstrates bibasilar atelectasis but no focal consolidation.  EKG demonstrates sinus tachycardia with no e/o ischemia or arrhythmia.    I have personally reviewed and interpreted all labs and imaging.   Clinical Course as of 04/29/22 2116  Sat Apr 29, 2022  1920 Patient reevaluated. She is slightly more calm at this time. After 3 duonebs and IV Solu-Medrol, patient still with diffuse central and x-ray wheezing.  No hypoxia on room air, slightly tachypneic, no increased work of breathing at this time.  Encourage patient to come into the hospital for ongoing management of her asthma exacerbation, patient states that she does not want to come in.  She states that she will have to go and come back.  I encouraged her to stay and stated that her asthma exacerbation could get worse but she states she will to think about it. [HN]  1941 WBC(!): 25.5 Likely from steroids patient had been dosing herself at home but cannot r/o infectious process [HN]  1941 DG Chest Portable 1 View Mild bibasilar atelectasis. [HN]  1941 B Natriuretic Peptide: 57.0 [HN]  1941 Basic metabolic panel(!) Unremarkable in context of presentation [HN]  2108 Patient again reevaluated. States she would like to leave AMA  because she feels improved.  She is not requiring any oxygen but is still wheezing on expiration. I advised patient she should be admitted given the severity of her asthma exacerbation but she refuses. I explained to patient that she could be severe morbidity mortality leaving AGAINST MEDICAL ADVICE and she reports understanding.  Demonstrates capacity and able to understand the risks of her leaving today.  I will administer patient 40 mg prednisone p.o. prescription x5 days.  Patient instructed to follow-up with a  primary care physician call [facility] to schedule that appointment for next week.  Patient reports understanding, all questions answered patient dysfunction.  Patient also requests a work note for the next 3 days. [HN]    Clinical Course User Index [HN] Audley Hose, MD   Dispo: AMA    Final Clinical Impression(s) / ED Diagnoses Final diagnoses:  Exacerbation of asthma, unspecified asthma severity, unspecified whether persistent  Anxiety state    Rx / DC Orders ED Discharge Orders          Ordered    predniSONE (DELTASONE) 20 MG tablet  Daily        04/29/22 2115             This note was created using dictation software, which may contain spelling or grammatical errors.    Audley Hose, MD 04/29/22 2116

## 2022-04-29 NOTE — ED Notes (Signed)
Refusing ativan and magnesium. Medications discussed at length including rationale and benefits.

## 2022-04-29 NOTE — ED Notes (Signed)
EDP at BS 

## 2022-04-29 NOTE — ED Notes (Signed)
RT at BS.

## 2022-04-29 NOTE — ED Notes (Signed)
Refused lab draw. Pt poorly tolerated blood drawn from IV, poorly tolerated BP cuff and removed BP cuff, pt remains irritable, and crying. Attempted to reassure and give rationale of care, orders, meds and tx.

## 2022-04-29 NOTE — ED Triage Notes (Signed)
Pt with hx of asthma and having issues with it for awhile, admits to taking prednisone. Pt seen at [facility] couple hours and sent here. Denies any fevers.  Covid test was negative

## 2022-05-09 ENCOUNTER — Ambulatory Visit (INDEPENDENT_AMBULATORY_CARE_PROVIDER_SITE_OTHER): Payer: Self-pay | Admitting: Internal Medicine

## 2022-05-09 ENCOUNTER — Encounter: Payer: Self-pay | Admitting: Internal Medicine

## 2022-05-09 DIAGNOSIS — J4541 Moderate persistent asthma with (acute) exacerbation: Secondary | ICD-10-CM

## 2022-05-09 DIAGNOSIS — J4540 Moderate persistent asthma, uncomplicated: Secondary | ICD-10-CM | POA: Insufficient documentation

## 2022-05-09 MED ORDER — ALBUTEROL SULFATE 108 (90 BASE) MCG/ACT IN AEPB: 2.0000 | INHALATION_SPRAY | Freq: Four times a day (QID) | RESPIRATORY_TRACT | 0 refills | Status: DC | PRN

## 2022-05-09 MED ORDER — BUDESONIDE-FORMOTEROL FUMARATE 160-4.5 MCG/ACT IN AERO: 2.0000 | INHALATION_SPRAY | Freq: Two times a day (BID) | RESPIRATORY_TRACT | 3 refills | Status: AC

## 2022-05-09 NOTE — Progress Notes (Signed)
Acute Office Visit  CC: Shortness of Breath  HPI:Nicole Bird is a 36 y.o. female who presents for evaluation of shortness of breath. For the details of today's visit, please refer to the assessment and plan.  Past Medical History:  Diagnosis Date   Allergy    Anxiety    Asthma    Depression    Dizziness    GERD (gastroesophageal reflux disease)    Headache    Tachycardia    Vitamin D deficiency    Past Surgical History:  Procedure Laterality Date   WISDOM TOOTH EXTRACTION     Allergies  Allergen Reactions   Benadryl [Diphenhydramine Hcl (Sleep)] Anaphylaxis   Tramadol     Serotonin toxicicity    Levaquin [Levofloxacin In D5w] Rash and Other (See Comments)    Pt. States it makes her really hot and sweaty    Current Outpatient Medications on File Prior to Visit  Medication Sig Dispense Refill   albuterol (PROVENTIL) (2.5 MG/3ML) 0.083% nebulizer solution SMARTSIG:1 Vial(s) Via Nebulizer Every 4-6 Hours PRN     ARIPiprazole (ABILIFY) 10 MG tablet Take 10 mg by mouth daily.     cetirizine (ZYRTEC) 10 MG tablet Take 10 mg by mouth daily.     Cholecalciferol (VITAMIN D PO) Take 5,000 Units by mouth daily.     pantoprazole (PROTONIX) 40 MG tablet Take 40 mg by mouth daily.     predniSONE (DELTASONE) 10 MG tablet Take by mouth.     doxycycline (VIBRAMYCIN) 100 MG capsule Take 100 mg by mouth 2 (two) times daily. (Patient not taking: Reported on 05/09/2022)     No current facility-administered medications on file prior to visit.   Family History  Problem Relation Age of Onset   Drug abuse Mother    Pulmonary fibrosis Mother    Other Father        Marena Chancy - never met   Asthma Brother    Allergic rhinitis Brother    Autism spectrum disorder Brother    Social History   Tobacco Use   Smoking status: Every Day    Packs/day: 0.50    Types: Cigarettes   Smokeless tobacco: Never  Substance Use Topics   Alcohol use: No   Drug use: No     Review of Systems:     Review of Systems  Constitutional:  Negative for chills and fever.  HENT:  Negative for congestion and sinus pain.   Respiratory:  Positive for cough and shortness of breath. Negative for sputum production and wheezing.   Cardiovascular:  Negative for chest pain and leg swelling.  Gastrointestinal:  Positive for heartburn. Negative for abdominal pain.  Genitourinary:  Negative for dysuria and urgency.  Musculoskeletal:  Positive for joint pain. Negative for falls.  Skin:  Negative for itching and rash.  Neurological:  Positive for dizziness and weakness.     Physical Exam: Vitals:   05/09/22 1424  BP: 119/80  Pulse: (!) 132  Resp: (!) 21  SpO2: 96%  Weight: 215 lb (97.5 kg)  Height: $Remove'5\' 5"'OGUzrFr$  (1.651 m)     Physical Exam Constitutional:      General: She is not in acute distress. HENT:     Right Ear: External ear normal.     Left Ear: External ear normal.     Mouth/Throat:     Mouth: Mucous membranes are moist.     Pharynx: No oropharyngeal exudate.  Eyes:     Extraocular Movements: Extraocular movements intact.  Conjunctiva/sclera: Conjunctivae normal.  Cardiovascular:     Rate and Rhythm: Regular rhythm. Tachycardia present.  Pulmonary:     Effort: No respiratory distress.     Breath sounds: Wheezing present. No rales.  Abdominal:     Tenderness: There is no guarding or rebound.  Musculoskeletal:        General: No swelling or tenderness.  Skin:    General: Skin is warm and dry.  Neurological:     General: No focal deficit present.     Mental Status: She is alert and oriented to person, place, and time.      Assessment & Plan:   Moderate persistent asthma with exacerbation Patient presents to establish care and has recently been treated for asthma exacerbation at ED. She was being seen by a friend who is an NP until she can get into our office. She is currently using albuterol inhaler and on steroid taper. She is having daily symptoms , waking up in the  middle of the night often, using albuterol inhaler at least every 4 hours, and has not been working due to these symptoms. She stopped smoking when exacerbation started. She has not been able to identify any other triggers.   Assessment/Plan: Moderate persistent asthma with acute exacerbation - Start maintenance inhaler , Symbicort.  - Continue Albuterol for rescue inhaler - Continue steroid taper - Follow up in one week for evaluation.        Lorene Dy, MD

## 2022-05-09 NOTE — Assessment & Plan Note (Addendum)
Patient presents to establish care and has recently been treated for asthma exacerbation at ED. She was being seen by a friend who is an NP until she can get into our office. She is currently using albuterol inhaler and on steroid taper. She is having daily symptoms , waking up in the middle of the night often, using albuterol inhaler at least every 2 hours, and has not been working due to these symptoms. She stopped smoking when exacerbation started. She has not been able to identify any other triggers.   Assessment/Plan: Moderate persistent asthma with acute exacerbation. Patient is tachycardic and likely due to frequent albuterol use and steroids.  - Start maintenance inhaler , Symbicort.  - Continue Albuterol for rescue inhaler - Continue steroid taper - Follow up in one week for evaluation.

## 2022-05-09 NOTE — Patient Instructions (Addendum)
Thank you for trusting me with your care. To recap, today we discussed the following:   Moderate Persistent Asthma - I recommend continuing taper prednisone you are on - Start Symbicort  - Albuterol as needed - Follow up in one week or sooner if worsening.

## 2022-05-12 ENCOUNTER — Telehealth: Payer: Self-pay | Admitting: Family Medicine

## 2022-05-12 NOTE — Telephone Encounter (Signed)
Pt calling in regard to previous tele

## 2022-05-12 NOTE — Telephone Encounter (Signed)
Patient called in regard to last tele visit on 10/10.  Patient wants to extend work note through the weekend to return to work on Monday. Patient still not feeling well and still taking breathing treatments.   Patient wants a call back     Aslo wants to let provider know that budesonide-formoterol (SYMBICORT) 160-4.5 MCG/ACT inhaler   Is still too expensive

## 2022-05-12 NOTE — Telephone Encounter (Signed)
Brandi approved, Work note has been printed for pick up.

## 2022-05-16 ENCOUNTER — Ambulatory Visit (INDEPENDENT_AMBULATORY_CARE_PROVIDER_SITE_OTHER): Payer: Self-pay | Admitting: Internal Medicine

## 2022-05-16 ENCOUNTER — Encounter: Payer: Self-pay | Admitting: Internal Medicine

## 2022-05-16 DIAGNOSIS — R000 Tachycardia, unspecified: Secondary | ICD-10-CM | POA: Insufficient documentation

## 2022-05-16 DIAGNOSIS — J4541 Moderate persistent asthma with (acute) exacerbation: Secondary | ICD-10-CM

## 2022-05-16 MED ORDER — ALBUTEROL SULFATE HFA 108 (90 BASE) MCG/ACT IN AERS: 2.0000 | INHALATION_SPRAY | Freq: Four times a day (QID) | RESPIRATORY_TRACT | 2 refills | Status: AC | PRN

## 2022-05-16 NOTE — Assessment & Plan Note (Addendum)
Patient tachycardic again at todays visit. Reports long history of tachycardia.Improved from last visit. I reviewed previous EKG from one month ago and patient was in sinus rhythm. Will continue to monitor at visit.

## 2022-05-16 NOTE — Assessment & Plan Note (Addendum)
Patient was unable to buy prescription of Symbicort due to cost. She did get a sample of Symbicort 84.97mcg. She is using once a day. She has not been using albuterol inhaler She uses nebulizer when she wakes up at night. She has finished her steroid taper. Her symptoms are improved during the day. She is waking up once or twice a night with shortness of breath. She has not had PFT's in many years.   Assessment/Plan: Moderate persistent asthma. Improving with use of Symbicort. 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 I asked her to increase to using Symbicort twice daily. Once she has insurance we can escalated to Symbicort 164.5 if needed. Also would like to have her scheduled for PFT's once she has insurance. Possible component of sleep apnea with symptoms bothering her more at night and improved during the day. Follow up in 2 weeks.

## 2022-05-16 NOTE — Patient Instructions (Signed)
Thank you for trusting me with your care. To recap, today we discussed the following:   Start taking Symbicort 2 puffs and twice a day  Pick up albuterol inhaler  Follow up in 2 weeks

## 2022-05-16 NOTE — Progress Notes (Signed)
     CC: asthma  HPI:Ms.Nicole Bird is a 36 y.o. female who presents for evaluation of asthma. For the details of today's visit, please refer to the assessment and plan.   Past Medical History:  Diagnosis Date   Allergy    Anxiety    Asthma    Depression    Dizziness    GERD (gastroesophageal reflux disease)    Headache    Tachycardia    Vitamin D deficiency     Review of Systems:    Review of Systems  Constitutional:  Negative for chills and fever.  Respiratory:  Negative for sputum production, shortness of breath and wheezing.      Physical Exam: Vitals:   05/16/22 0827  BP: 135/83  Pulse: (!) 115  Resp: 19  SpO2: 97%  Weight: 217 lb (98.4 kg)  Height: 5\' 5"  (1.651 m)     Physical Exam Constitutional:      General: She is not in acute distress.    Appearance: She is not ill-appearing.  Cardiovascular:     Rate and Rhythm: Regular rhythm. Tachycardia present.  Pulmonary:     Effort: Pulmonary effort is normal.     Comments: Scattered rhonchi, no wheezes, or rales Skin:    General: Skin is warm and dry.      Assessment & Plan:   Moderate persistent asthma with exacerbation Patient was unable to buy prescription of Symbicort due to cost. She did get a sample of Symbicort 84.34mcg. She is using once a day. She has not been using albuterol inhaler She uses nebulizer when she wakes up at night. She has finished her steroid taper. Her symptoms are improved during the day. She is waking up once or twice a night with shortness of breath. She has not had PFT's in many years.   Assessment/Plan: Moderate persistent asthma. Improving with use of Symbicort. 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 I asked her to increase to using Symbicort twice daily. Once she has insurance we can escalated to Symbicort 164.5 if needed. Also would like to have her scheduled for PFT's once she has insurance. Possible component of sleep apnea with symptoms bothering her more at  night and improved during the day. Follow up in 2 weeks.   Sinus tachycardia Patient tachycardic again at todays visit. Reports long history of tachycardia.Improved from last visit. I reviewed previous EKG from one month ago and patient was in sinus rhythm. Will continue to monitor at visit.    Lorene Dy, MD

## 2022-05-23 ENCOUNTER — Ambulatory Visit: Payer: Self-pay | Admitting: Family Medicine

## 2022-05-30 ENCOUNTER — Ambulatory Visit: Payer: Self-pay | Admitting: Internal Medicine

## 2022-06-26 ENCOUNTER — Ambulatory Visit (INDEPENDENT_AMBULATORY_CARE_PROVIDER_SITE_OTHER): Payer: Self-pay | Admitting: Internal Medicine

## 2022-06-26 ENCOUNTER — Encounter: Payer: Self-pay | Admitting: Internal Medicine

## 2022-06-26 VITALS — BP 118/77 | HR 116 | Resp 16 | Ht 65.0 in | Wt 214.0 lb

## 2022-06-26 DIAGNOSIS — J4540 Moderate persistent asthma, uncomplicated: Secondary | ICD-10-CM

## 2022-06-26 DIAGNOSIS — G4700 Insomnia, unspecified: Secondary | ICD-10-CM | POA: Insufficient documentation

## 2022-06-26 DIAGNOSIS — F329 Major depressive disorder, single episode, unspecified: Secondary | ICD-10-CM | POA: Insufficient documentation

## 2022-06-26 DIAGNOSIS — F3342 Major depressive disorder, recurrent, in full remission: Secondary | ICD-10-CM

## 2022-06-26 DIAGNOSIS — K219 Gastro-esophageal reflux disease without esophagitis: Secondary | ICD-10-CM

## 2022-06-26 DIAGNOSIS — F5101 Primary insomnia: Secondary | ICD-10-CM

## 2022-06-26 MED ORDER — PANTOPRAZOLE SODIUM 40 MG PO TBEC: 40.0000 mg | DELAYED_RELEASE_TABLET | Freq: Every day | ORAL | 2 refills | Status: AC

## 2022-06-26 MED ORDER — ARIPIPRAZOLE 10 MG PO TABS: 10.0000 mg | ORAL_TABLET | Freq: Every day | ORAL | 2 refills | Status: AC

## 2022-06-26 MED ORDER — ONDANSETRON HCL 4 MG PO TABS: 4.0000 mg | ORAL_TABLET | Freq: Three times a day (TID) | ORAL | 0 refills | Status: AC | PRN

## 2022-06-26 MED ORDER — RAMELTEON 8 MG PO TABS: 8.0000 mg | ORAL_TABLET | Freq: Every day | ORAL | 0 refills | Status: AC
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
## 2022-06-26 NOTE — Assessment & Plan Note (Addendum)
Patient reports previous history of insomnia. She was treated with benzodiazepine, but was able to come off of this medication after DBT. She has not found trazodone , bonine, or melatonin to help. History of MDD, but no history of mania or diagnosis of bipolar disorder. Has problem getting to sleep. Right now she is going through a stressful time with losing her job and her significant other is cheating.   Assessment/Plan: chronic problem , with acute worsening. Will prescribe Ramelteon.
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
## 2022-06-26 NOTE — Patient Instructions (Signed)
Thank you for trusting me with your care. To recap, today we discussed the following:   Refilled medications  - ARIPiprazole (ABILIFY) 10 MG tablet; Take 1 tablet (10 mg total) by mouth daily.  Dispense: 30 tablet; Refill: 2 - pantoprazole (PROTONIX) 40 MG tablet; Take 1 tablet (40 mg total) by mouth daily.  Dispense: 30 tablet; Refill: 2  Insomnia  - ramelteon (ROZEREM) 8 MG tablet; Take 1 tablet (8 mg total) by mouth at bedtime.  Dispense: 30 tablet; Refill: 0

## 2022-06-26 NOTE — Assessment & Plan Note (Signed)
Patient is here for follow up of chronic asthma. She has medicaid pending and may have insurance next week. She has continued to get samples of Symbicort 80/4.5. If she is able to obtain insurance then she will pick up Symbicort 160/4.5 mcg. She has not had any acute exacerbations requiring her to go to hospital. She continues to have wheezing. Continues to smoke daily, but has cut back to 3 cigarettes per day Continue Symbicort  Albuterol PRN When insurance is obtained will send for PFTs  Continue to counsel on smoking cessation

## 2022-09-27 ENCOUNTER — Ambulatory Visit: Payer: Medicaid Other | Admitting: Internal Medicine

## 2022-09-27 ENCOUNTER — Encounter: Payer: Self-pay | Admitting: Family Medicine

## 2023-05-22 ENCOUNTER — Ambulatory Visit (INDEPENDENT_AMBULATORY_CARE_PROVIDER_SITE_OTHER): Payer: Self-pay

## 2023-05-22 ENCOUNTER — Ambulatory Visit
Admission: EM | Admit: 2023-05-22 | Discharge: 2023-05-22 | Disposition: A | Discharge status: Home / Self Care | Payer: Medicaid Other | Attending: Physician Assistant | Admitting: Physician Assistant

## 2023-05-22 DIAGNOSIS — J189 Pneumonia, unspecified organism: Secondary | ICD-10-CM

## 2023-05-22 DIAGNOSIS — B370 Candidal stomatitis: Secondary | ICD-10-CM

## 2023-05-22 LAB — POCT RAPID STREP A (OFFICE): Rapid Strep A Screen: NEGATIVE

## 2023-05-22 MED ORDER — AZITHROMYCIN 250 MG PO TABS: 250.0000 mg | ORAL_TABLET | Freq: Every day | ORAL | 0 refills | Status: AC

## 2023-05-22 MED ORDER — FLUCONAZOLE 150 MG PO TABS: ORAL_TABLET | ORAL | 0 refills | Status: AC

## 2023-05-22 MED ORDER — AMOXICILLIN 500 MG PO CAPS: 1000.0000 mg | ORAL_CAPSULE | Freq: Three times a day (TID) | ORAL | 0 refills | Status: AC

## 2023-05-22 NOTE — ED Triage Notes (Signed)
Patient reports symptoms last week of fever, trembling that lasted for 5 days. Tempeture of 104.9 on Wednesday. States she was treated at urgent care for a viral infection on Tuesday 10/15. States those symptoms went away. Sunday, upper back pain, sore throat and thrush. States she took Diflucan last night and Tylenol. Tested negative for Covid and flu at [facility]. Pain is in esophagus and upper back.

## 2023-06-13 NOTE — ED Provider Notes (Signed)
[facility]    CSN: [id number] Arrival date & time: 05/22/23  1005      History   Chief Complaint No chief complaint on file.   HPI Mikaia VAYLA DOMEIER is a 37 y.o. female.   Patient here today for evaluation of fever that started about a week ago. She reports that she was evaluated at [facility] and had covid screening as well as flu test that was negative. She was told she likely had other viral illness. She reports she is concerned she now has thrush. She took a single does of diflucan last night without resolution. She reports pain in her esophagus and upper back  The history is provided by the patient.    Past Medical History:  Diagnosis Date   Allergy    Anxiety    Asthma    Depression    Dizziness    GERD (gastroesophageal reflux disease)    Headache    Tachycardia    Vitamin D deficiency     Patient Active Problem List   Diagnosis Date Noted   MDD (major depressive disorder) 06/26/2022   Insomnia 06/26/2022   Moderate persistent asthma 05/09/2022    Past Surgical History:  Procedure Laterality Date   WISDOM TOOTH EXTRACTION      OB History   No obstetric history on file.      Home Medications    Prior to Admission medications   Medication Sig Start Date End Date Taking? Authorizing Provider  azithromycin (ZITHROMAX) 250 MG tablet Take 1 tablet (250 mg total) by mouth daily. Take first 2 tablets together, then 1 every day until finished. 05/22/23  Yes Tomi Bamberger, PA-C  fluconazole (DIFLUCAN) 150 MG tablet Take one tab PO today and repeat dose in 3 days if symptoms persist 05/22/23  Yes Tomi Bamberger, PA-C  albuterol (PROVENTIL) (2.5 MG/3ML) 0.083% nebulizer solution SMARTSIG:1 Vial(s) Via Nebulizer Every 4-6 Hours PRN 05/07/22   [provider]  albuterol (VENTOLIN HFA) 108 (90 Base) MCG/ACT inhaler Inhale 2 puffs into the lungs every 6 (six) hours as needed for wheezing or shortness of breath. 05/16/22   Gardenia Phlegm, MD   ARIPiprazole (ABILIFY) 10 MG tablet Take 1 tablet (10 mg total) by mouth daily. 06/26/22   Gardenia Phlegm, MD  budesonide-formoterol [facility]) 160-4.5 MCG/ACT inhaler Inhale 2 puffs into the lungs 2 (two) times daily. Patient not taking: Reported on 06/26/2022 05/09/22   Gardenia Phlegm, MD  budesonide-formoterol [facility]) 80-4.5 MCG/ACT inhaler Inhale 2 puffs into the lungs 2 (two) times daily.    [provider]  cetirizine (ZYRTEC) 10 MG tablet Take 10 mg by mouth daily.    [provider]  Cholecalciferol (VITAMIN D PO) Take 5,000 Units by mouth daily.    [provider]  ondansetron (ZOFRAN) 4 MG tablet Take 1 tablet (4 mg total) by mouth every 8 (eight) hours as needed for nausea or vomiting. 06/26/22   Gardenia Phlegm, MD  pantoprazole (PROTONIX) 40 MG tablet Take 1 tablet (40 mg total) by mouth daily. 06/26/22   Gardenia Phlegm, MD  ramelteon (ROZEREM) 8 MG tablet Take 1 tablet (8 mg total) by mouth at bedtime. 06/26/22   Gardenia Phlegm, MD    Family History Family History  Problem Relation Age of Onset   Drug abuse Mother    Pulmonary fibrosis Mother    Other Father        Posey Rea - never met   Asthma Brother  Allergic rhinitis Brother    Autism spectrum disorder Brother     Social History Social History   Tobacco Use   Smoking status: Every Day    Current packs/day: 0.50    Types: Cigarettes   Smokeless tobacco: Never  Substance Use Topics   Alcohol use: No   Drug use: No     Allergies   Benadryl [diphenhydramine hcl (sleep)], Tramadol, and Levaquin [levofloxacin in d5w]   Review of Systems Review of Systems  Constitutional:  Negative for chills and fever.  HENT:  Positive for congestion and sore throat. Negative for ear pain.   Eyes:  Negative for discharge and redness.  Respiratory:  Positive for cough. Negative for shortness of breath and wheezing.   Gastrointestinal:  Negative for abdominal pain, diarrhea, nausea and  vomiting.  Musculoskeletal:  Positive for back pain.     Physical Exam Triage Vital Signs ED Triage Vitals  Encounter Vitals Group     BP 05/22/23 1055 115/80     Systolic BP Percentile --      Diastolic BP Percentile --      Pulse Rate 05/22/23 1055 (!) 129     Resp --      Temp 05/22/23 1055 99.4 F (37.4 C)     Temp Source 05/22/23 1055 Oral     SpO2 05/22/23 1055 97 %     Weight 05/22/23 1051 192 lb (87.1 kg)     Height 05/22/23 1051 5\' 5"  (1.651 m)     Head Circumference --      Peak Flow --      Pain Score 05/22/23 1051 5     Pain Loc --      Pain Education --      Exclude from Growth Chart --    No data found.  Updated Vital Signs BP 115/80 (BP Location: Right Leg)   Pulse (!) 129   Temp 99.4 F (37.4 C) (Oral)   Ht 5\' 5"  (1.651 m)   Wt 192 lb (87.1 kg)   LMP 02/01/2021 (Approximate)   SpO2 97%   BMI 31.95 kg/m   Visual Acuity Right Eye Distance:   Left Eye Distance:   Bilateral Distance:    Right Eye Near:   Left Eye Near:    Bilateral Near:     Physical Exam Vitals and nursing note reviewed.  Constitutional:      General: She is not in acute distress.    Appearance: Normal appearance. She is not ill-appearing.  HENT:     Head: Normocephalic and atraumatic.     Right Ear: Tympanic membrane normal.     Left Ear: Tympanic membrane normal.     Nose: Congestion present.     Mouth/Throat:     Mouth: Mucous membranes are moist.     Pharynx: No oropharyngeal exudate or posterior oropharyngeal erythema.     Comments: White coating noted to tongue Eyes:     Conjunctiva/sclera: Conjunctivae normal.  Cardiovascular:     Rate and Rhythm: Normal rate and regular rhythm.     Heart sounds: Normal heart sounds. No murmur heard. Pulmonary:     Effort: Pulmonary effort is normal. No respiratory distress.     Breath sounds: Rhonchi (mild bilateral bases) present. No wheezing or rales.  Skin:    General: Skin is warm and dry.  Neurological:     Mental  Status: She is alert.  Psychiatric:        Mood and Affect: Mood normal.  Thought Content: Thought content normal.      UC Treatments / Results  Labs (all labs ordered are listed, but only abnormal results are displayed) Labs Reviewed  POCT RAPID STREP A (OFFICE) - Normal    EKG   Radiology No results found.  Procedures Procedures (including critical care time)  Medications Ordered in UC Medications - No data to display  Initial Impression / Assessment and Plan / UC Course  I have reviewed the triage vital signs and the nursing notes.  Pertinent labs & imaging results that were available during my care of the patient were reviewed by me and considered in my medical decision making (see chart for details).    CXR without acute findings however adventitious lung sounds noted to bilateral lung bases on exam. Will treat to cover pneumonia as well as thrush. Advised follow up if no gradual improvement or ED with any worsening.   Final Clinical Impressions(s) / UC Diagnoses   Final diagnoses:  Pneumonia of both lower lobes due to infectious organism  Thrush   Discharge Instructions   None    ED Prescriptions     Medication Sig Dispense Auth. Provider   amoxicillin (AMOXIL) 500 MG capsule Take 2 capsules (1,000 mg total) by mouth 3 (three) times daily for 7 days. 42 capsule Erma Pinto F, PA-C   azithromycin (ZITHROMAX) 250 MG tablet Take 1 tablet (250 mg total) by mouth daily. Take first 2 tablets together, then 1 every day until finished. 6 tablet Erma Pinto F, PA-C   fluconazole (DIFLUCAN) 150 MG tablet Take one tab PO today and repeat dose in 3 days if symptoms persist 2 tablet Tomi Bamberger, PA-C      PDMP not reviewed this encounter.   Tomi Bamberger, PA-C 06/13/23 2300

## 2024-07-07 ENCOUNTER — Ambulatory Visit: Admission: EM | Admit: 2024-07-07 | Discharge: 2024-07-07 | Discharge status: Against Medical Advice | Payer: Self-pay

## 2024-07-07 ENCOUNTER — Emergency Department: Admission: EM | Admit: 2024-07-07 | Discharge: 2024-07-07 | Discharge status: Against Medical Advice | Payer: Self-pay

## 2024-07-07 ENCOUNTER — Emergency Department: Payer: Self-pay

## 2024-07-07 ENCOUNTER — Other Ambulatory Visit: Payer: Self-pay

## 2024-07-07 NOTE — ED Triage Notes (Signed)
 Pt comes in via pov with complaints of a fall on last Monday morning down some stairs. Pt states that she was going so fast down the stairs that she laded about 12 feet from the stairs. Pt landed on her right side. Pt is hurting in her right hip, leg and right side of her pelvis.Th last couple of days, the pain has gotten worse.  Pt feels like her right leg is bigger than the left leg.  Pt with complaints of pain 10/10 at this time.

## 2024-07-07 NOTE — ED Provider Notes (Signed)
 I signed up for this patient has patient was assigned a room at my side.  However I never saw this patient and was later notified that the patient eloped prior to me seeing or evaluation her.   Nicholaus Rolland BRAVO, MD 07/07/24 2352
